# Patient Record
Sex: Male | Born: 1995 | Race: White | Hispanic: No | Marital: Single
Health system: Southern US, Community
[De-identification: ages and names within clinical notes are randomized; demographics above are authoritative.]

---

## 2018-12-07 ENCOUNTER — Emergency Department (HOSPITAL_COMMUNITY): Payer: BC Managed Care – PPO

## 2018-12-07 ENCOUNTER — Emergency Department (HOSPITAL_COMMUNITY)
Admission: EM | Admit: 2018-12-07 | Discharge: 2018-12-07 | Disposition: A | Discharge status: Home / Self Care | Payer: BC Managed Care – PPO | Attending: Emergency Medicine | Admitting: Emergency Medicine

## 2018-12-07 ENCOUNTER — Other Ambulatory Visit: Payer: Self-pay

## 2018-12-07 ENCOUNTER — Encounter (HOSPITAL_COMMUNITY): Payer: Self-pay

## 2018-12-07 DIAGNOSIS — Y999 Unspecified external cause status: Secondary | ICD-10-CM | POA: Insufficient documentation

## 2018-12-07 DIAGNOSIS — W231XXA Caught, crushed, jammed, or pinched between stationary objects, initial encounter: Secondary | ICD-10-CM | POA: Diagnosis not present

## 2018-12-07 DIAGNOSIS — R4020 Unspecified coma: Secondary | ICD-10-CM | POA: Diagnosis not present

## 2018-12-07 DIAGNOSIS — S3991XA Unspecified injury of abdomen, initial encounter: Secondary | ICD-10-CM | POA: Diagnosis not present

## 2018-12-07 DIAGNOSIS — T07XXXA Unspecified multiple injuries, initial encounter: Secondary | ICD-10-CM | POA: Diagnosis not present

## 2018-12-07 DIAGNOSIS — M545 Low back pain: Secondary | ICD-10-CM | POA: Diagnosis not present

## 2018-12-07 DIAGNOSIS — S12690A Other displaced fracture of seventh cervical vertebra, initial encounter for closed fracture: Secondary | ICD-10-CM | POA: Diagnosis not present

## 2018-12-07 DIAGNOSIS — R102 Pelvic and perineal pain: Secondary | ICD-10-CM | POA: Diagnosis not present

## 2018-12-07 DIAGNOSIS — Y93H9 Activity, other involving exterior property and land maintenance, building and construction: Secondary | ICD-10-CM | POA: Insufficient documentation

## 2018-12-07 DIAGNOSIS — S3992XA Unspecified injury of lower back, initial encounter: Secondary | ICD-10-CM | POA: Diagnosis not present

## 2018-12-07 DIAGNOSIS — R55 Syncope and collapse: Secondary | ICD-10-CM | POA: Insufficient documentation

## 2018-12-07 DIAGNOSIS — R079 Chest pain, unspecified: Secondary | ICD-10-CM | POA: Diagnosis not present

## 2018-12-07 DIAGNOSIS — S299XXA Unspecified injury of thorax, initial encounter: Secondary | ICD-10-CM | POA: Diagnosis not present

## 2018-12-07 DIAGNOSIS — S3993XA Unspecified injury of pelvis, initial encounter: Secondary | ICD-10-CM | POA: Diagnosis not present

## 2018-12-07 DIAGNOSIS — S12600A Unspecified displaced fracture of seventh cervical vertebra, initial encounter for closed fracture: Secondary | ICD-10-CM | POA: Diagnosis not present

## 2018-12-07 DIAGNOSIS — Y9279 Other farm location as the place of occurrence of the external cause: Secondary | ICD-10-CM | POA: Diagnosis not present

## 2018-12-07 DIAGNOSIS — S199XXA Unspecified injury of neck, initial encounter: Secondary | ICD-10-CM | POA: Diagnosis not present

## 2018-12-07 DIAGNOSIS — S0990XA Unspecified injury of head, initial encounter: Secondary | ICD-10-CM | POA: Diagnosis not present

## 2018-12-07 DIAGNOSIS — R1030 Lower abdominal pain, unspecified: Secondary | ICD-10-CM | POA: Insufficient documentation

## 2018-12-07 DIAGNOSIS — R52 Pain, unspecified: Secondary | ICD-10-CM | POA: Diagnosis not present

## 2018-12-07 LAB — PROTIME-INR
INR: 1 (ref 0.8–1.2)
Prothrombin Time: 13.4 seconds (ref 11.4–15.2)

## 2018-12-07 LAB — CBC
HCT: 45.1 % (ref 39.0–52.0)
Hemoglobin: 15.5 g/dL (ref 13.0–17.0)
MCH: 30 pg (ref 26.0–34.0)
MCHC: 34.4 g/dL (ref 30.0–36.0)
MCV: 87.2 fL (ref 80.0–100.0)
Platelets: 240 10*3/uL (ref 150–400)
RBC: 5.17 MIL/uL (ref 4.22–5.81)
RDW: 12.8 % (ref 11.5–15.5)
WBC: 11.9 10*3/uL — ABNORMAL HIGH (ref 4.0–10.5)
nRBC: 0 % (ref 0.0–0.2)

## 2018-12-07 LAB — I-STAT CHEM 8, ED
BUN: 16 mg/dL (ref 6–20)
Calcium, Ion: 1.03 mmol/L — ABNORMAL LOW (ref 1.15–1.40)
Chloride: 105 mmol/L (ref 98–111)
Creatinine, Ser: 0.9 mg/dL (ref 0.61–1.24)
Glucose, Bld: 107 mg/dL — ABNORMAL HIGH (ref 70–99)
HCT: 44 % (ref 39.0–52.0)
Hemoglobin: 15 g/dL (ref 13.0–17.0)
Potassium: 4.2 mmol/L (ref 3.5–5.1)
Sodium: 139 mmol/L (ref 135–145)
TCO2: 24 mmol/L (ref 22–32)

## 2018-12-07 LAB — SAMPLE TO BLOOD BANK

## 2018-12-07 LAB — COMPREHENSIVE METABOLIC PANEL
ALT: 71 U/L — ABNORMAL HIGH (ref 0–44)
AST: 44 U/L — ABNORMAL HIGH (ref 15–41)
Albumin: 4.4 g/dL (ref 3.5–5.0)
Alkaline Phosphatase: 54 U/L (ref 38–126)
Anion gap: 12 (ref 5–15)
BUN: 12 mg/dL (ref 6–20)
CO2: 20 mmol/L — ABNORMAL LOW (ref 22–32)
Calcium: 9.2 mg/dL (ref 8.9–10.3)
Chloride: 107 mmol/L (ref 98–111)
Creatinine, Ser: 0.99 mg/dL (ref 0.61–1.24)
GFR calc Af Amer: >60 mL/min (ref >60)
GFR calc non Af Amer: >60 mL/min (ref >60)
Glucose, Bld: 108 mg/dL — ABNORMAL HIGH (ref 70–99)
Potassium: 4.4 mmol/L (ref 3.5–5.1)
Sodium: 139 mmol/L (ref 135–145)
Total Bilirubin: 1.1 mg/dL (ref 0.3–1.2)
Total Protein: 7.1 g/dL (ref 6.5–8.1)

## 2018-12-07 LAB — URINALYSIS, ROUTINE W REFLEX MICROSCOPIC
Bilirubin Urine: NEGATIVE
Glucose, UA: NEGATIVE mg/dL
Hgb urine dipstick: NEGATIVE
Ketones, ur: NEGATIVE mg/dL
Leukocytes,Ua: NEGATIVE
Nitrite: NEGATIVE
Protein, ur: NEGATIVE mg/dL
Specific Gravity, Urine: 1.039 — ABNORMAL HIGH (ref 1.005–1.030)
pH: 5 (ref 5.0–8.0)

## 2018-12-07 LAB — CDS SEROLOGY

## 2018-12-07 LAB — ETHANOL: Alcohol, Ethyl (B): <10 mg/dL (ref <10)

## 2018-12-07 LAB — LACTIC ACID, PLASMA: Lactic Acid, Venous: 1.5 mmol/L (ref 0.5–1.9)

## 2018-12-07 MED ORDER — FENTANYL CITRATE (PF) 100 MCG/2ML IJ SOLN
INTRAMUSCULAR | Status: AC
  Filled 2018-12-07: qty 2

## 2018-12-07 MED ORDER — MORPHINE SULFATE (PF) 4 MG/ML IV SOLN
4.0000 mg | Freq: Once | INTRAVENOUS | Status: AC
  Administered 2018-12-07: 4 mg via INTRAVENOUS
  Filled 2018-12-07: qty 1

## 2018-12-07 MED ORDER — OXYCODONE-ACETAMINOPHEN 5-325 MG PO TABS: 1.0000 | ORAL_TABLET | Freq: Four times a day (QID) | ORAL | 0 refills | Status: AC | PRN

## 2018-12-07 MED ORDER — FENTANYL CITRATE (PF) 100 MCG/2ML IJ SOLN
100.0000 ug | Freq: Once | INTRAMUSCULAR | Status: DC
  Filled 2018-12-07: qty 2

## 2018-12-07 MED ORDER — FENTANYL CITRATE (PF) 100 MCG/2ML IJ SOLN
INTRAMUSCULAR | Status: AC | PRN
  Administered 2018-12-07: 100 ug via INTRAVENOUS

## 2018-12-07 MED ORDER — IOHEXOL 300 MG/ML  SOLN
100.0000 mL | Freq: Once | INTRAMUSCULAR | Status: AC | PRN
  Administered 2018-12-07: 100 mL via INTRAVENOUS
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
## 2018-12-07 NOTE — Progress Notes (Signed)
Orthopedic Tech Progress Note Patient Details:  Jordan Mcdonald Jun 08, 1995 [id number]  Level 1 trauma ortho visit  Patient ID: Jordan Mcdonald, male   DOB: 23-Apr-1995, 23 y.o.   MRN: [id number]   Jordan Mcdonald 12/07/2018, 2:53 PM

## 2018-12-07 NOTE — ED Notes (Signed)
Pt had removed C[place]lar due to it being uncomfortable 5 minutes ago. Pt placed in [place] collar at this time. Pt had also removed his BP cuff. BP cuff replaced. Pt educated on need to leave [place] on and in place. Pt verbalized understanding.
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
## 2018-12-07 NOTE — ED Provider Notes (Signed)
Emergency Department Provider Note   I have reviewed the triage vital signs and the nursing notes.   HISTORY  Chief Complaint Trauma   HPI Jordan Mcdonald is a 23 y.o. male presents to the emergency department by EMS as a level 2 trauma after an approximately 850 pound bale of hay fell on him from approximately 5 feet.  He was pinned against a bar and felt a pop/pain in his neck.  Patient was witnessed to then have a syncope event but awoke immediately.  They deny any seizure activity.  Patient with pain in his lower abdomen, back, neck at this time.  No numbness, tingling, weakness.  No vision changes.  No chest pain or shortness of breath. EMS noted bradycardia in route as low as 45.    History reviewed. No pertinent past medical history.  There are no active problems to display for this patient.  Allergies Patient has no known allergies.  No family history on file.  Social History Social History   Tobacco Use   Smoking status: Not on file  Substance Use Topics   Alcohol use: Not on file   Drug use: Not on file    Review of Systems  Constitutional: No fever/chills Eyes: No visual changes. ENT: No sore throat. Cardiovascular: Denies chest pain. Positive syncope.  Respiratory: Denies shortness of breath. Gastrointestinal: Positive lower abdominal pain.  No nausea, no vomiting.  No diarrhea.  No constipation. Genitourinary: Negative for dysuria. Musculoskeletal: Positive lower back and neck pain.  Skin: Negative for rash. Neurological: Negative for headaches, focal weakness or numbness.  10-point ROS otherwise negative.  ____________________________________________   PHYSICAL EXAM:  VITAL SIGNS: ED Triage Vitals  Enc Vitals Group     BP 12/07/18 1430 (!) 151/87     Pulse Rate 12/07/18 1430 62     Resp 12/07/18 1430 15     Temp 12/07/18 1430 (!) 97.1 F (36.2 C)     Temp src --      SpO2 12/07/18 1423 96 %     Weight 12/07/18 1427 220 lb (99.8 kg)      Height 12/07/18 1427 5\' 7"  (1.702 m)   Constitutional: Alert and oriented. Well appearing and in no acute distress. Eyes: Conjunctivae are normal. PERRL.  Head: Atraumatic. Ears:  Healthy appearing ear canals and TMs bilaterally.  No hemotympanum.  Nose: No congestion/rhinnorhea. Mouth/Throat: Mucous membranes are moist.  Oropharynx non-erythematous. Neck: No stridor. C collar in place. In-line spine stabilization maintained with palpation along the c-spine. Patient with Tenderness along the midline in the C4-6 region.  Cardiovascular: Normal rate, regular rhythm. Good peripheral circulation. Grossly normal heart sounds.   Respiratory: Normal respiratory effort.  No retractions. Lungs CTAB. Gastrointestinal: Soft with tenderness diffusely in the lower abdomen. No rebound, guarding, or bruising. No distention.  Musculoskeletal: No lower extremity tenderness nor edema. No gross deformities of extremities. Stable pelvis.  Neurologic:  Normal speech and language. No gross focal neurologic deficits are appreciated. Normal strength and sensation in the upper and lower extremities. GCS 15.  Skin:  Skin is warm, dry and intact. No rash noted.   ____________________________________________   LABS (all labs ordered are listed, but only abnormal results are displayed)  Labs Reviewed  COMPREHENSIVE METABOLIC PANEL - Abnormal; Notable for the following components:      Result Value   CO2 20 (*)    Glucose, Bld 108 (*)    AST 44 (*)    ALT 71 (*)  All other components within normal limits  CBC - Abnormal; Notable for the following components:   WBC 11.9 (*)    All other components within normal limits  URINALYSIS, ROUTINE W REFLEX MICROSCOPIC - Abnormal; Notable for the following components:   Specific Gravity, Urine 1.039 (*)    All other components within normal limits  I-STAT CHEM 8, ED - Abnormal; Notable for the following components:   Glucose, Bld 107 (*)    Calcium, Ion 1.03 (*)     All other components within normal limits  CDS SEROLOGY  ETHANOL  LACTIC ACID, PLASMA  PROTIME-INR  SAMPLE TO BLOOD BANK   ____________________________________________  EKG   EKG Interpretation  Date/Time:  Wednesday December 07 2018 15:03:29 EST Ventricular Rate:  82 PR Interval:    QRS Duration: 102 QT Interval:  348 QTC Calculation: 407 R Axis:   48 Text Interpretation: Sinus rhythm No old tracing to compare Confirmed by Meridee ScoreButler, Michael [phone]([id number]) on 12/07/2018 4:27:40 PM       ____________________________________________  RADIOLOGY  Ct Head Wo Contrast  Result Date: 12/07/2018 CLINICAL DATA:  Struck by bale of hay EXAM: CT HEAD WITHOUT CONTRAST CT CERVICAL SPINE WITHOUT CONTRAST TECHNIQUE: Multidetector CT imaging of the head and cervical spine was performed following the standard protocol without intravenous contrast. Multiplanar CT image reconstructions of the cervical spine were also generated. COMPARISON:  None. FINDINGS: CT HEAD FINDINGS Brain: No evidence of acute infarction, hemorrhage, hydrocephalus, extra-axial collection or mass lesion/mass effect. Vascular: No hyperdense vessel or unexpected calcification. Skull: No calvarial fracture or suspicious osseous lesion. No scalp swelling or hematoma. Sinuses/Orbits: Paranasal sinuses and mastoid air cells are predominantly clear. Included orbital structures are unremarkable. Other: None CT CERVICAL SPINE FINDINGS Alignment: Preservation of the normal cervical lordosis without traumatic listhesis. No abnormal facet widening. Normal alignment of the craniocervical and atlantoaxial articulations. Skull base and vertebrae: Complex minimally displaced fracture involving the lateral mass of C7 with separation from the pedicle and lamina (a floating lateral mass). There is a minimally displaced fracture extending through the right transverse process of T1 as well. No other cervical spine fracture or traumatic malalignment. Soft  tissues and spinal canal: Small amount of stranding and thickening adjacent the C7 lateral mass and T1 transverse process fracture. No visible canal hematoma. Disc levels: No significant central canal or foraminal stenosis identified within the imaged levels of the spine. No fracture violation of the neural foramina or spinal canal. Upper chest: No acute abnormality in the upper chest or imaged lung apices. Dedicated CT of the chest, abdomen and pelvis was performed concurrently. Other: Normal thyroid. IMPRESSION: 1. Complex minimally displaced fracture involving the lateral mass of C7 with separation from the pedicle and lamina (a floating lateral mass - AOSPINE FIII) (10/27, 25; 8/65). 2. Minimally displaced fracture of the right transverse process of T1 (10/27) 3. No other cervical spine fracture or traumatic listhesis. 4. No acute intracranial abnormality. These results were called by telephone at the time of interpretation on 12/07/2018 at 3:34 pm to provider Orell Hurtado , who verbally acknowledged these results. Electronically Signed   By: Kreg ShropshirePrice  DeHay M.D.   On: 12/07/2018 15:42   Ct Chest W Contrast  Result Date: 12/07/2018 CLINICAL DATA:  Struck by hay bale EXAM: CT CHEST, ABDOMEN, AND PELVIS WITH CONTRAST TECHNIQUE: Multidetector CT imaging of the chest, abdomen and pelvis was performed following the standard protocol during bolus administration of intravenous contrast. CONTRAST:  100mL OMNIPAQUE IOHEXOL 300 MG/ML  SOLN COMPARISON:  None. FINDINGS: CT CHEST FINDINGS Cardiovascular: The aortic root is suboptimally assessed given cardiac pulsation artifact. The aorta is normal caliber. No other acute luminal abnormality of the aorta is seen. No periaortic stranding or hemorrhage. Normal heart size. No pericardial effusion. Central pulmonary arteries are normal caliber. No large central filling defects on this non tailored examination. Mediastinum/Nodes: No mediastinal hematoma or pneumomediastinum. No  acute traumatic abnormality of the trachea or esophagus. Thyroid gland and thoracic inlet are unremarkable. Lungs/Pleura: Small amount of dependent atelectasis posteriorly. No acute traumatic injury of the lung parenchyma. Pulmonary cyst noted in the left lung base. No consolidation, features of edema, pneumothorax, or effusion. No suspicious pulmonary nodules or masses. Musculoskeletal: Complex C7 right lateral mass fracture better detailed on cervical spine CT. Transverse process fracture of T1-1 on the right is also better seen on the comparison CT cervical spine imaging. No other acute osseous or soft tissue traumatic process in the chest. CT ABDOMEN PELVIS FINDINGS Hepatobiliary: Diffuse hepatic hypoattenuation compatible with hepatic steatosis. No focal liver abnormality is seen. No gallstones, gallbladder wall thickening, or biliary dilatation. Pancreas: Unremarkable. No pancreatic ductal dilatation or surrounding inflammatory changes. Spleen: No splenic injury or perisplenic hematoma. Small accessory splenule Adrenals/Urinary Tract: No adrenal hematoma or suspicious adrenal lesions. No direct renal injury or perirenal hemorrhage. No extravasation of contrast is seen on excretory phase delayed imaging. Kidneys are otherwise unremarkable, without renal calculi, suspicious lesion, or hydronephrosis. Bladder is unremarkable. Stomach/Bowel: Distal esophagus, stomach and duodenal sweep are unremarkable. No small bowel wall thickening or dilatation. No evidence of obstruction. A normal appendix is visualized. No colonic dilatation or wall thickening. Scattered colonic diverticula without focal pericolonic inflammation to suggest diverticulitis. No mesenteric hematoma or contusion Vascular/Lymphatic: No direct vascular injury in the abdomen or pelvis. No suspicious or enlarged lymph nodes in the included lymphatic chains. Reproductive: The prostate and seminal vesicles are unremarkable. Other: No free fluid or free  air. No traumatic abdominal wall hernia. Musculoskeletal: No acute osseous or soft tissue injury in the abdomen or pelvis. Benign bone islands are present in the right ilium/acetabulum and right femoral head. No suspicious osseous lesions IMPRESSION: 1. No evidence of acute traumatic injury within the chest, abdomen, or pelvis. 2. Fractures of C7 and T1 are better detailed on concurrent CT cervical spine imaging. Please refer to the dedicated report. 3. Hepatic steatosis. These results were called by telephone at the time of interpretation on 12/07/2018 at 3:34 pm to provider Mariel Gaudin , who verbally acknowledged these results. Electronically Signed   By: Kreg Shropshire M.D.   On: 12/07/2018 15:41   Ct Cervical Spine Wo Contrast  Result Date: 12/07/2018 CLINICAL DATA:  Struck by bale of hay EXAM: CT HEAD WITHOUT CONTRAST CT CERVICAL SPINE WITHOUT CONTRAST TECHNIQUE: Multidetector CT imaging of the head and cervical spine was performed following the standard protocol without intravenous contrast. Multiplanar CT image reconstructions of the cervical spine were also generated. COMPARISON:  None. FINDINGS: CT HEAD FINDINGS Brain: No evidence of acute infarction, hemorrhage, hydrocephalus, extra-axial collection or mass lesion/mass effect. Vascular: No hyperdense vessel or unexpected calcification. Skull: No calvarial fracture or suspicious osseous lesion. No scalp swelling or hematoma. Sinuses/Orbits: Paranasal sinuses and mastoid air cells are predominantly clear. Included orbital structures are unremarkable. Other: None CT CERVICAL SPINE FINDINGS Alignment: Preservation of the normal cervical lordosis without traumatic listhesis. No abnormal facet widening. Normal alignment of the craniocervical and atlantoaxial articulations. Skull base and vertebrae: Complex minimally displaced fracture involving the lateral mass  of C7 with separation from the pedicle and lamina (a floating lateral mass). There is a minimally  displaced fracture extending through the right transverse process of T1 as well. No other cervical spine fracture or traumatic malalignment. Soft tissues and spinal canal: Small amount of stranding and thickening adjacent the C7 lateral mass and T1 transverse process fracture. No visible canal hematoma. Disc levels: No significant central canal or foraminal stenosis identified within the imaged levels of the spine. No fracture violation of the neural foramina or spinal canal. Upper chest: No acute abnormality in the upper chest or imaged lung apices. Dedicated CT of the chest, abdomen and pelvis was performed concurrently. Other: Normal thyroid. IMPRESSION: 1. Complex minimally displaced fracture involving the lateral mass of C7 with separation from the pedicle and lamina (a floating lateral mass - AOSPINE FIII) (10/27, 25; 8/65). 2. Minimally displaced fracture of the right transverse process of T1 (10/27) 3. No other cervical spine fracture or traumatic listhesis. 4. No acute intracranial abnormality. These results were called by telephone at the time of interpretation on 12/07/2018 at 3:34 pm to provider Ahmaud Duthie , who verbally acknowledged these results. Electronically Signed   By: Kreg Shropshire M.D.   On: 12/07/2018 15:42   Mr Cervical Spine Wo Contrast  Result Date: 12/07/2018 CLINICAL DATA:  Fractures of C7 and T1. EXAM: MRI CERVICAL SPINE WITHOUT CONTRAST TECHNIQUE: Multiplanar, multisequence MR imaging of the cervical spine was performed. No intravenous contrast was administered. COMPARISON:  CT scan dated 12/07/2018 FINDINGS: Alignment: Physiologic. Vertebrae: There is a subtle fracture of the right lateral mass of T7 with minimal impaction but without significant displacement. No visible foraminal encroachment. Slight haziness in the right neural foramen at C6-7 which could represent minimal hemorrhage. No adjacent hematoma. Subtle small effusions of the low right facet joints at C6-7 and C7-T1  seen on image 1 of series 4. Cord: Normal signal and morphology. Posterior Fossa, vertebral arteries, paraspinal tissues: Negative. Disc levels: C2-3 through C5-6: Normal. C6-7: Tiny central disc bulge and annular fissure without neural impingement. Subtle haziness in the right neural foramen. Subtle fracture through the right lateral mass with a minimal effusion in the right facet joint. No evidence of a hematoma in the spinal canal. C7-T1: Disc is normal. Minimal effusion in the right facet joint. Otherwise normal. T1-2 and T2-3: Normal. IMPRESSION: 1. Subtle fracture of the right lateral mass of T7 with minimal impaction but no significant displacement. 2. Subtle haziness in the right neural foramen at C6-7 which could represent minimal hemorrhage. Electronically Signed   By: Francene Boyers M.D.   On: 12/07/2018 18:18   Ct Abdomen Pelvis W Contrast  Result Date: 12/07/2018 CLINICAL DATA:  Struck by hay bale EXAM: CT CHEST, ABDOMEN, AND PELVIS WITH CONTRAST TECHNIQUE: Multidetector CT imaging of the chest, abdomen and pelvis was performed following the standard protocol during bolus administration of intravenous contrast. CONTRAST:  OMNIPAQUE IOHEXOL 300 MG/ML  SOLN COMPARISON:  None. FINDINGS: CT CHEST FINDINGS Cardiovascular: The aortic root is suboptimally assessed given cardiac pulsation artifact. The aorta is normal caliber. No other acute luminal abnormality of the aorta is seen. No periaortic stranding or hemorrhage. Normal heart size. No pericardial effusion. Central pulmonary arteries are normal caliber. No large central filling defects on this non tailored examination. Mediastinum/Nodes: No mediastinal hematoma or pneumomediastinum. No acute traumatic abnormality of the trachea or esophagus. Thyroid gland and thoracic inlet are unremarkable. Lungs/Pleura: Small amount of dependent atelectasis posteriorly. No acute traumatic injury of  the lung parenchyma. Pulmonary cyst noted in the left lung  base. No consolidation, features of edema, pneumothorax, or effusion. No suspicious pulmonary nodules or masses. Musculoskeletal: Complex C7 right lateral mass fracture better detailed on cervical spine CT. Transverse process fracture of T1-1 on the right is also better seen on the comparison CT cervical spine imaging. No other acute osseous or soft tissue traumatic process in the chest. CT ABDOMEN PELVIS FINDINGS Hepatobiliary: Diffuse hepatic hypoattenuation compatible with hepatic steatosis. No focal liver abnormality is seen. No gallstones, gallbladder wall thickening, or biliary dilatation. Pancreas: Unremarkable. No pancreatic ductal dilatation or surrounding inflammatory changes. Spleen: No splenic injury or perisplenic hematoma. Small accessory splenule Adrenals/Urinary Tract: No adrenal hematoma or suspicious adrenal lesions. No direct renal injury or perirenal hemorrhage. No extravasation of contrast is seen on excretory phase delayed imaging. Kidneys are otherwise unremarkable, without renal calculi, suspicious lesion, or hydronephrosis. Bladder is unremarkable. Stomach/Bowel: Distal esophagus, stomach and duodenal sweep are unremarkable. No small bowel wall thickening or dilatation. No evidence of obstruction. A normal appendix is visualized. No colonic dilatation or wall thickening. Scattered colonic diverticula without focal pericolonic inflammation to suggest diverticulitis. No mesenteric hematoma or contusion Vascular/Lymphatic: No direct vascular injury in the abdomen or pelvis. No suspicious or enlarged lymph nodes in the included lymphatic chains. Reproductive: The prostate and seminal vesicles are unremarkable. Other: No free fluid or free air. No traumatic abdominal wall hernia. Musculoskeletal: No acute osseous or soft tissue injury in the abdomen or pelvis. Benign bone islands are present in the right ilium/acetabulum and right femoral head. No suspicious osseous lesions IMPRESSION: 1. No  evidence of acute traumatic injury within the chest, abdomen, or pelvis. 2. Fractures of C7 and T1 are better detailed on concurrent CT cervical spine imaging. Please refer to the dedicated report. 3. Hepatic steatosis. These results were called by telephone at the time of interpretation on 12/07/2018 at 3:34 pm to provider Verle Brillhart , who verbally acknowledged these results. Electronically Signed   By: Kreg Shropshire M.D.   On: 12/07/2018 15:41   Dg Pelvis Portable  Result Date: 12/07/2018 CLINICAL DATA:  Trauma with pain EXAM: PORTABLE PELVIS 1-2 VIEWS COMPARISON:  None. FINDINGS: There is no evidence of pelvic fracture or dislocation. Joint spaces appear unremarkable. No erosive change. IMPRESSION: No evident fracture or dislocation.  No appreciable arthropathy. Electronically Signed   By: Bretta Bang III M.D.   On: 12/07/2018 14:44   Dg Chest Port 1 View  Result Date: 12/07/2018 CLINICAL DATA:  Pain following trauma with heavy object falling upon patient EXAM: PORTABLE CHEST 1 VIEW COMPARISON:  February 16, 2011 FINDINGS: Lungs are clear. Heart is borderline enlarged with pulmonary vascularity normal. No pneumothorax. No adenopathy. No bone lesions are demonstrable. IMPRESSION: Borderline cardiac prominence.  Lungs clear.  No pneumothorax. Electronically Signed   By: Bretta Bang III M.D.   On: 12/07/2018 14:45    ____________________________________________   PROCEDURES  Procedure(s) performed:   Procedures  FAST Exam: Limited Ultrasound of the abdomen and pericardium (FAST Exam).  Multiple views of the abdomen and pericardium are obtained with a multi-frequency probe.  EMERGENCY DEPARTMENT [place] FAST EXAM  INDICATIONS:Abnornal vitals, Blunt trauma to the thorax and Blunt injury of abdomen  PERFORMED BY: Myself  FINDINGS: All views negative  LIMITATIONS:  Body habitus and Emergent procedure  INTERPRETATION:  No abdominal free fluid and No pericardial effusion  CPT  Codes: cardiac [id number], abdomen [phone] (study includes both codes)   CRITICAL CARE Performed  by: Maia Plan Total critical care time: 35 minutes Critical care time was exclusive of separately billable procedures and treating other patients. Critical care was necessary to treat or prevent imminent or life-threatening deterioration. Critical care was time spent personally by me on the following activities: development of treatment plan with patient and/or surrogate as well as nursing, discussions with consultants, evaluation of patient's response to treatment, examination of patient, obtaining history from patient or surrogate, ordering and performing treatments and interventions, ordering and review of laboratory studies, ordering and review of radiographic studies, pulse oximetry and re-evaluation of patient's condition.  Alona Bene, MD Emergency Medicine  ____________________________________________   INITIAL IMPRESSION / ASSESSMENT AND PLAN / ED COURSE  Pertinent labs & imaging results that were available during my care of the patient were reviewed by me and considered in my medical decision making (see chart for details).   Patient arrives as a level 2 trauma.  GCS 15.  EMS reports some bradycardia in route but no hypotension.  Patient apparently had syncope on scene which I suspect is vasovagal.  He states that in the past when he has injured himself he is passed out as well.  FAST exam negative.  Reviewed the plain films of the chest and pelvis and the trauma bay with no obvious pneumothorax or pelvic fracture.  Patient going for pan scan given the mechanism.   03:35 PM  Spoke with radiology regarding CT c spine findings. Paged NSG.   Discussed with NSG. [employer] and will send for MRI to assess for ligamentous injury. Care transferred to Dr. Charm Barges.  ____________________________________________  FINAL CLINICAL IMPRESSION(S) / ED DIAGNOSES  Final diagnoses:  Closed  fracture of seventh cervical vertebra without spinal cord injury, initial encounter [facility])     MEDICATIONS GIVEN DURING THIS VISIT:  Medications  fentaNYL (SUBLIMAZE) injection (100 mcg Intravenous Given 12/07/18 1428)  iohexol (OMNIPAQUE) 300 MG/ML solution 100 mL (100 mLs Intravenous Contrast Given 12/07/18 1504)  morphine 4 MG/ML injection 4 mg (4 mg Intravenous Given 12/07/18 1622)     NEW OUTPATIENT MEDICATIONS STARTED DURING THIS VISIT:  Discharge Medication List as of 12/07/2018  7:20 PM    START taking these medications   Details  oxyCODONE-acetaminophen (PERCOCET/ROXICET) 5-325 MG tablet Take 1-2 tablets by mouth every 6 (six) hours as needed for severe pain., Starting Wed 12/07/2018, Normal        Note:  This document was prepared using Dragon voice recognition software and may include unintentional dictation errors.  Alona Bene, MD, [facility] Emergency Medicine    Tykera Skates, Arlyss Repress, MD 12/08/18 [phone]

## 2018-12-13 DIAGNOSIS — S12691A Other nondisplaced fracture of seventh cervical vertebra, initial encounter for closed fracture: Secondary | ICD-10-CM | POA: Diagnosis not present

## 2018-12-30 DIAGNOSIS — S12691A Other nondisplaced fracture of seventh cervical vertebra, initial encounter for closed fracture: Secondary | ICD-10-CM | POA: Diagnosis not present

## 2019-01-03 ENCOUNTER — Other Ambulatory Visit: Payer: Self-pay | Admitting: Neurosurgery

## 2019-01-03 DIAGNOSIS — S12691A Other nondisplaced fracture of seventh cervical vertebra, initial encounter for closed fracture: Secondary | ICD-10-CM

## 2019-01-24 ENCOUNTER — Ambulatory Visit
Admission: RE | Admit: 2019-01-24 | Discharge: 2019-01-24 | Disposition: A | Discharge status: Home / Self Care | Payer: BC Managed Care – PPO | Source: Ambulatory Visit | Attending: Neurosurgery | Admitting: Neurosurgery

## 2019-01-24 ENCOUNTER — Other Ambulatory Visit: Payer: Self-pay

## 2019-01-24 DIAGNOSIS — S12601D Unspecified nondisplaced fracture of seventh cervical vertebra, subsequent encounter for fracture with routine healing: Secondary | ICD-10-CM | POA: Diagnosis not present

## 2019-01-24 DIAGNOSIS — S12691A Other nondisplaced fracture of seventh cervical vertebra, initial encounter for closed fracture: Secondary | ICD-10-CM | POA: Diagnosis not present

## 2019-02-21 DIAGNOSIS — S12691A Other nondisplaced fracture of seventh cervical vertebra, initial encounter for closed fracture: Secondary | ICD-10-CM | POA: Diagnosis not present

## 2019-04-04 DIAGNOSIS — G8929 Other chronic pain: Secondary | ICD-10-CM | POA: Diagnosis not present

## 2019-04-04 DIAGNOSIS — S12691A Other nondisplaced fracture of seventh cervical vertebra, initial encounter for closed fracture: Secondary | ICD-10-CM | POA: Diagnosis not present

## 2019-04-04 DIAGNOSIS — M545 Low back pain: Secondary | ICD-10-CM | POA: Diagnosis not present

## 2019-04-11 DIAGNOSIS — M545 Low back pain: Secondary | ICD-10-CM | POA: Diagnosis not present

## 2019-04-11 DIAGNOSIS — M546 Pain in thoracic spine: Secondary | ICD-10-CM | POA: Diagnosis not present

## 2019-05-30 DIAGNOSIS — Z6825 Body mass index (BMI) 25.0-25.9, adult: Secondary | ICD-10-CM | POA: Diagnosis not present

## 2020-05-07 DIAGNOSIS — F419 Anxiety disorder, unspecified: Secondary | ICD-10-CM | POA: Diagnosis not present

## 2020-05-07 DIAGNOSIS — G40909 Epilepsy, unspecified, not intractable, without status epilepticus: Secondary | ICD-10-CM | POA: Diagnosis not present

## 2020-05-10 IMAGING — CT CT CERVICAL SPINE W/O CM
2 series · 10 of 14 positions shown, 12 images · non-contrast
Comparison: 12/07/2018 cervical spine CT.

CLINICAL DATA: 24-year-old male status post right C7 facet and
right T1 transverse process fractures in [REDACTED].

EXAM:
CT CERVICAL SPINE WITHOUT CONTRAST
TECHNIQUE: Multidetector CT imaging of the cervical spine was performed without
intravenous contrast. Multiplanar CT image reconstructions were also
generated.

[Series 3: cspine soft · axial · 0.36mm/px · z∈[-235,-119]mm · 5 of 88 slices shown]
[im 15/88  soft-tissue]
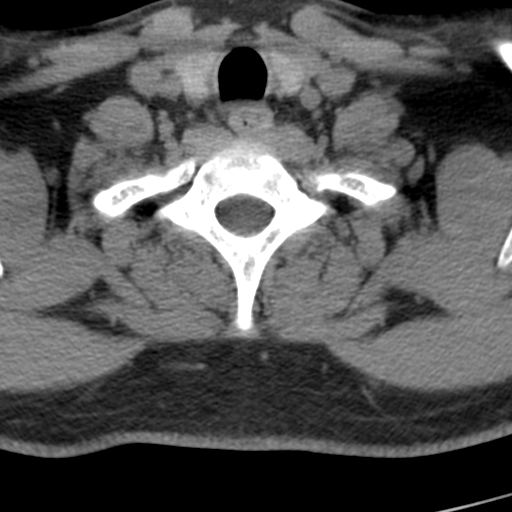
[im 30/88  soft-tissue]
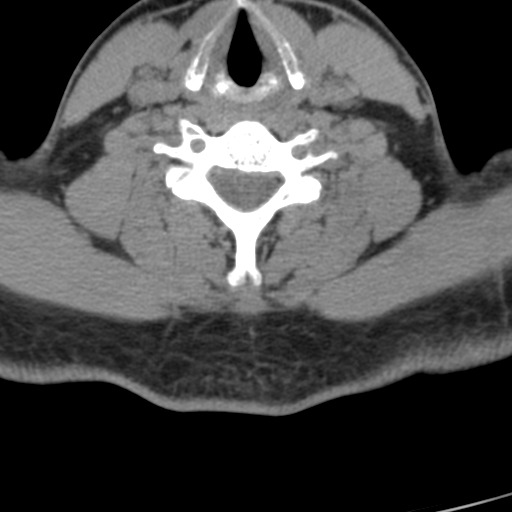
[im 44/88  soft-tissue]
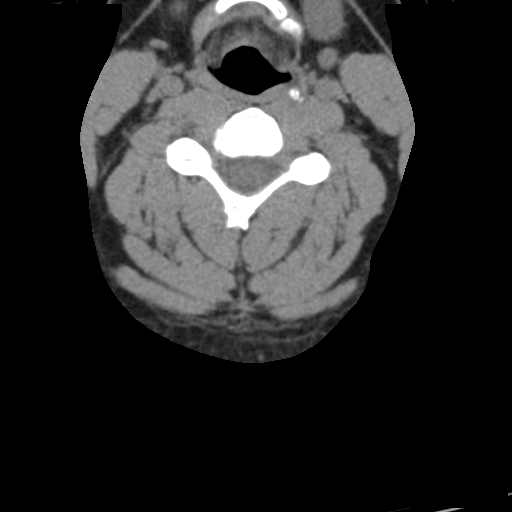
[im 59/88  soft-tissue]
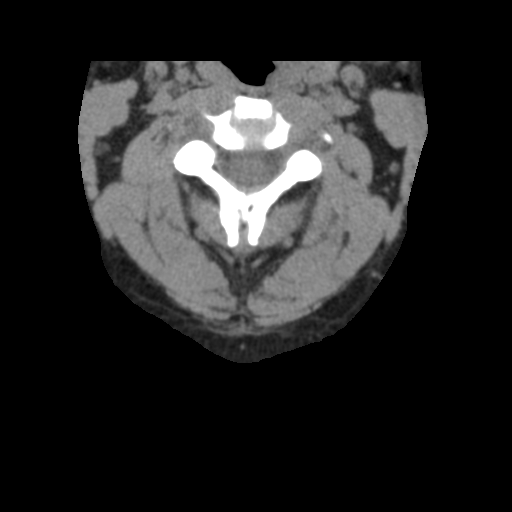
[im 73/88  soft-tissue]
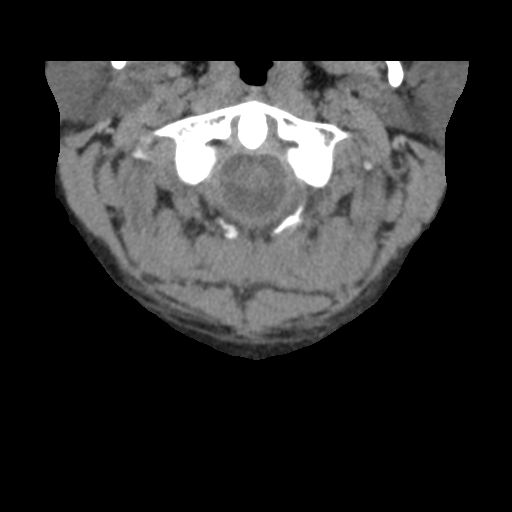

[Series 9: angled axial · axial · 0.29mm/px · z∈[-240,-123]mm · 5 of 89 slices shown, 7 images]
[im 15/89  soft-tissue]
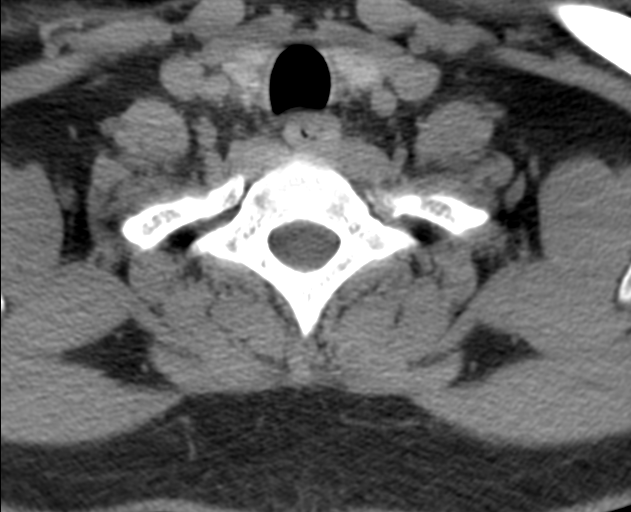
[im 15/89  bone]
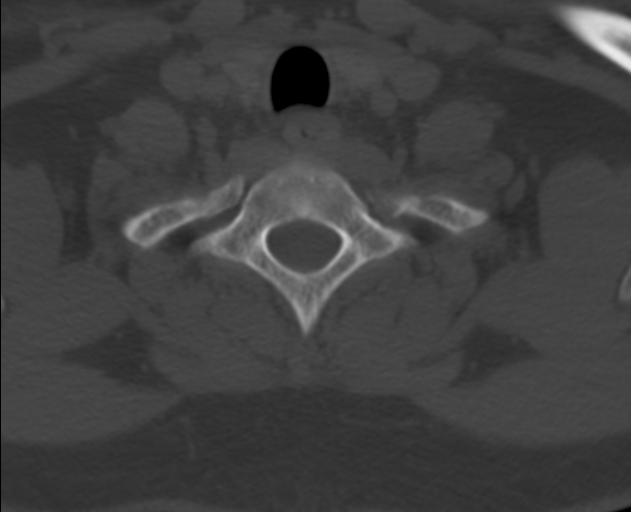
[im 30/89  bone]
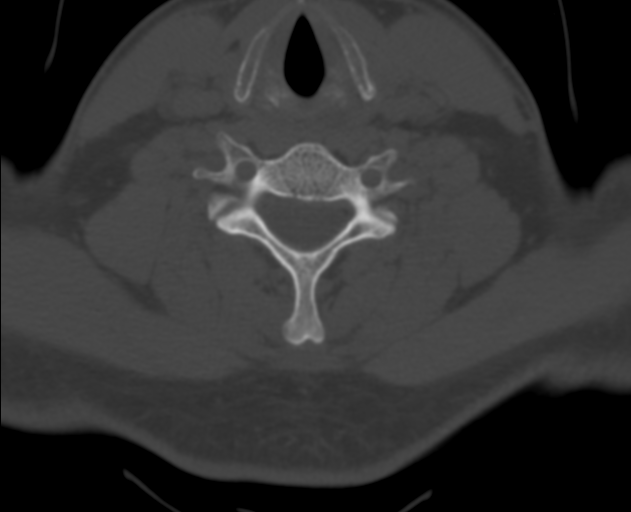
[im 45/89  bone]
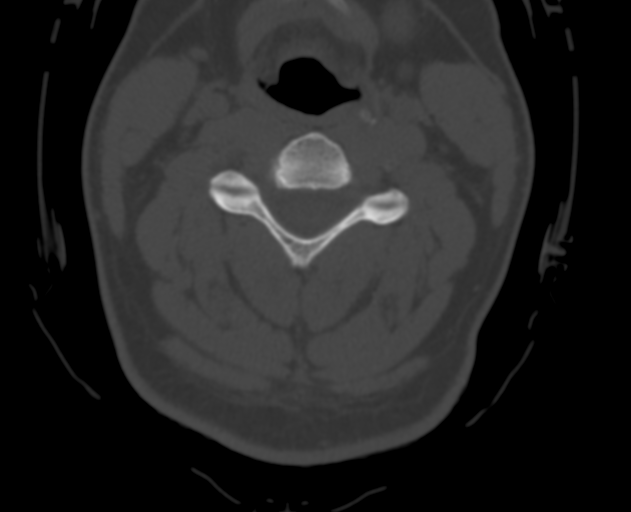
[im 59/89  bone]
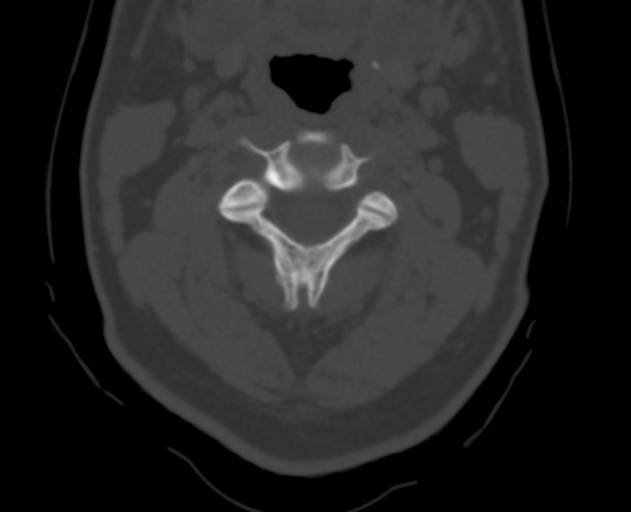
[im 74/89  soft-tissue]
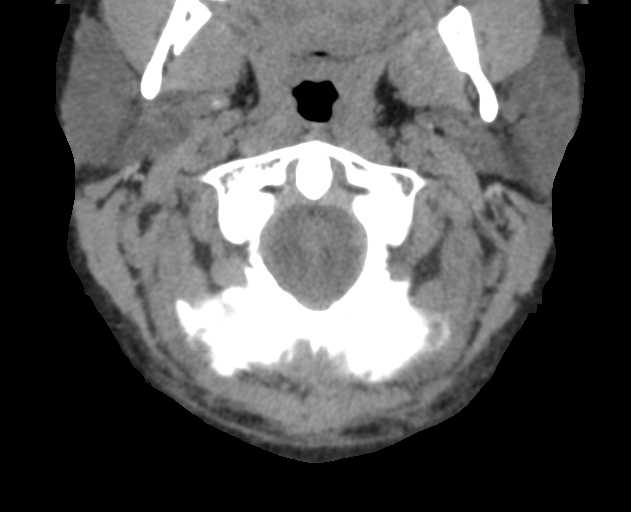
[im 74/89  bone]
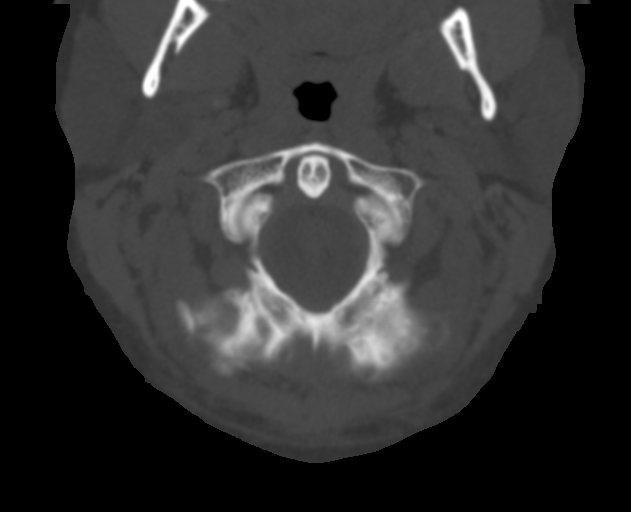

[10 of 14 positions shown; findings below may reference images not displayed]

FINDINGS: Alignment: Increase straightening of cervical lordosis.
Cervicothoracic junction alignment remains normal. Left posterior
element alignment remains normal. Contralateral right posterior
element alignment is normal except at C6-C7 described additionally
below.

Skull base and vertebrae: Visualized skull base is intact. No
atlanto-occipital dissociation. Normal C1-C2 alignment. The C1
through C6 vertebrae remain intact.

Partial healing has occurred at the right C7 comminuted facet
fracture, with substantially reduced fracture lucency on series 4,
image 64 today (compare to series 5, image 77 previously). There
remains some fracture lucency through the right C7 superior
articulating facet best seen on series 4, image 62 and sagittal
image 35. But this remains relatively nondisplaced. Associated
slight widening of the joint space at the superior articulating
facet and minimal perched appearance of the right C6 inferior
articulating facet (same sagittal image). But otherwise satisfactory
right side posterior element alignment there. The C7 vertebrae is
otherwise intact.

Soft tissues and spinal canal: No prevertebral fluid or swelling. No
visible canal hematoma. Negative noncontrast neck soft tissues.

Disc levels: Minor anterior disc calcification has occurred at
C6-C7. No other significant degenerative changes.

Upper chest: Subtle fracture of the right T1 transverse process
appears healed. Other visible upper thoracic levels appear intact.
Negative lung apices.

Other: Negative visible posterior fossa.
IMPRESSION: 1. Partial healing of the comminuted but nondisplaced right C7 facet
fracture since [REDACTED]. Fracture lucency remains most visible at
the superior articulating facet which is also slightly displaced
anteriorly. Posterior element alignment otherwise satisfactory.

2. No new osseous abnormality in the cervical spine. Healed subtle
fracture of the right T1 transverse process.

## 2020-05-15 DIAGNOSIS — F419 Anxiety disorder, unspecified: Secondary | ICD-10-CM | POA: Diagnosis not present

## 2020-05-23 DIAGNOSIS — F419 Anxiety disorder, unspecified: Secondary | ICD-10-CM | POA: Diagnosis not present

## 2020-05-28 DIAGNOSIS — F1210 Cannabis abuse, uncomplicated: Secondary | ICD-10-CM | POA: Diagnosis not present

## 2020-05-28 DIAGNOSIS — G8929 Other chronic pain: Secondary | ICD-10-CM | POA: Diagnosis not present

## 2020-05-28 DIAGNOSIS — M545 Low back pain, unspecified: Secondary | ICD-10-CM | POA: Diagnosis not present

## 2020-05-28 DIAGNOSIS — F410 Panic disorder [episodic paroxysmal anxiety] without agoraphobia: Secondary | ICD-10-CM | POA: Diagnosis not present

## 2020-05-29 DIAGNOSIS — F419 Anxiety disorder, unspecified: Secondary | ICD-10-CM | POA: Diagnosis not present

## 2020-06-04 DIAGNOSIS — F419 Anxiety disorder, unspecified: Secondary | ICD-10-CM | POA: Diagnosis not present

## 2020-06-07 DIAGNOSIS — M5416 Radiculopathy, lumbar region: Secondary | ICD-10-CM | POA: Diagnosis not present

## 2020-06-07 DIAGNOSIS — Z7689 Persons encountering health services in other specified circumstances: Secondary | ICD-10-CM | POA: Diagnosis not present

## 2020-06-07 DIAGNOSIS — E669 Obesity, unspecified: Secondary | ICD-10-CM | POA: Diagnosis not present

## 2020-06-07 DIAGNOSIS — F411 Generalized anxiety disorder: Secondary | ICD-10-CM | POA: Diagnosis not present

## 2020-06-14 DIAGNOSIS — F419 Anxiety disorder, unspecified: Secondary | ICD-10-CM | POA: Diagnosis not present

## 2020-06-18 DIAGNOSIS — M545 Low back pain, unspecified: Secondary | ICD-10-CM | POA: Diagnosis not present

## 2020-06-19 DIAGNOSIS — F419 Anxiety disorder, unspecified: Secondary | ICD-10-CM | POA: Diagnosis not present

## 2020-06-20 DIAGNOSIS — F419 Anxiety disorder, unspecified: Secondary | ICD-10-CM | POA: Diagnosis not present

## 2020-06-25 DIAGNOSIS — M545 Low back pain, unspecified: Secondary | ICD-10-CM | POA: Diagnosis not present

## 2020-06-26 DIAGNOSIS — F419 Anxiety disorder, unspecified: Secondary | ICD-10-CM | POA: Diagnosis not present

## 2020-07-01 DIAGNOSIS — M545 Low back pain, unspecified: Secondary | ICD-10-CM | POA: Diagnosis not present

## 2020-07-02 DIAGNOSIS — F419 Anxiety disorder, unspecified: Secondary | ICD-10-CM | POA: Diagnosis not present

## 2020-07-03 DIAGNOSIS — F419 Anxiety disorder, unspecified: Secondary | ICD-10-CM | POA: Diagnosis not present

## 2020-07-04 DIAGNOSIS — F419 Anxiety disorder, unspecified: Secondary | ICD-10-CM | POA: Diagnosis not present

## 2020-07-08 DIAGNOSIS — F419 Anxiety disorder, unspecified: Secondary | ICD-10-CM | POA: Diagnosis not present

## 2020-07-10 DIAGNOSIS — F419 Anxiety disorder, unspecified: Secondary | ICD-10-CM | POA: Diagnosis not present

## 2020-07-16 DIAGNOSIS — M791 Myalgia, unspecified site: Secondary | ICD-10-CM | POA: Diagnosis not present

## 2020-07-16 DIAGNOSIS — R269 Unspecified abnormalities of gait and mobility: Secondary | ICD-10-CM | POA: Diagnosis not present

## 2020-07-16 DIAGNOSIS — M25551 Pain in right hip: Secondary | ICD-10-CM | POA: Diagnosis not present

## 2020-07-16 DIAGNOSIS — M545 Low back pain, unspecified: Secondary | ICD-10-CM | POA: Diagnosis not present

## 2020-07-24 DIAGNOSIS — M545 Low back pain, unspecified: Secondary | ICD-10-CM | POA: Diagnosis not present

## 2020-07-24 DIAGNOSIS — M791 Myalgia, unspecified site: Secondary | ICD-10-CM | POA: Diagnosis not present

## 2020-07-24 DIAGNOSIS — M25551 Pain in right hip: Secondary | ICD-10-CM | POA: Diagnosis not present

## 2020-07-24 DIAGNOSIS — R269 Unspecified abnormalities of gait and mobility: Secondary | ICD-10-CM | POA: Diagnosis not present

## 2020-07-26 DIAGNOSIS — F419 Anxiety disorder, unspecified: Secondary | ICD-10-CM | POA: Diagnosis not present

## 2020-08-01 DIAGNOSIS — M545 Low back pain, unspecified: Secondary | ICD-10-CM | POA: Diagnosis not present

## 2020-08-01 DIAGNOSIS — M25551 Pain in right hip: Secondary | ICD-10-CM | POA: Diagnosis not present

## 2020-08-01 DIAGNOSIS — R269 Unspecified abnormalities of gait and mobility: Secondary | ICD-10-CM | POA: Diagnosis not present

## 2020-08-01 DIAGNOSIS — M791 Myalgia, unspecified site: Secondary | ICD-10-CM | POA: Diagnosis not present

## 2020-08-02 DIAGNOSIS — M545 Low back pain, unspecified: Secondary | ICD-10-CM | POA: Diagnosis not present

## 2020-08-02 DIAGNOSIS — M549 Dorsalgia, unspecified: Secondary | ICD-10-CM | POA: Diagnosis not present

## 2020-08-02 DIAGNOSIS — R61 Generalized hyperhidrosis: Secondary | ICD-10-CM | POA: Diagnosis not present

## 2020-08-02 DIAGNOSIS — W19XXXA Unspecified fall, initial encounter: Secondary | ICD-10-CM | POA: Diagnosis not present

## 2020-08-02 DIAGNOSIS — X58XXXA Exposure to other specified factors, initial encounter: Secondary | ICD-10-CM | POA: Diagnosis not present

## 2020-08-02 DIAGNOSIS — F419 Anxiety disorder, unspecified: Secondary | ICD-10-CM | POA: Diagnosis not present

## 2020-08-02 DIAGNOSIS — S32010A Wedge compression fracture of first lumbar vertebra, initial encounter for closed fracture: Secondary | ICD-10-CM | POA: Diagnosis not present

## 2020-08-07 DIAGNOSIS — F419 Anxiety disorder, unspecified: Secondary | ICD-10-CM | POA: Diagnosis not present

## 2020-08-07 DIAGNOSIS — R269 Unspecified abnormalities of gait and mobility: Secondary | ICD-10-CM | POA: Diagnosis not present

## 2020-08-07 DIAGNOSIS — M25551 Pain in right hip: Secondary | ICD-10-CM | POA: Diagnosis not present

## 2020-08-07 DIAGNOSIS — M545 Low back pain, unspecified: Secondary | ICD-10-CM | POA: Diagnosis not present

## 2020-08-07 DIAGNOSIS — M791 Myalgia, unspecified site: Secondary | ICD-10-CM | POA: Diagnosis not present

## 2020-08-14 DIAGNOSIS — M545 Low back pain, unspecified: Secondary | ICD-10-CM | POA: Diagnosis not present

## 2020-08-14 DIAGNOSIS — R269 Unspecified abnormalities of gait and mobility: Secondary | ICD-10-CM | POA: Diagnosis not present

## 2020-08-14 DIAGNOSIS — M791 Myalgia, unspecified site: Secondary | ICD-10-CM | POA: Diagnosis not present

## 2020-08-14 DIAGNOSIS — M25551 Pain in right hip: Secondary | ICD-10-CM | POA: Diagnosis not present

## 2020-08-19 DIAGNOSIS — M5416 Radiculopathy, lumbar region: Secondary | ICD-10-CM | POA: Diagnosis not present

## 2020-08-19 DIAGNOSIS — S32000A Wedge compression fracture of unspecified lumbar vertebra, initial encounter for closed fracture: Secondary | ICD-10-CM | POA: Diagnosis not present

## 2020-08-19 DIAGNOSIS — F411 Generalized anxiety disorder: Secondary | ICD-10-CM | POA: Diagnosis not present

## 2020-08-19 DIAGNOSIS — Z6831 Body mass index (BMI) 31.0-31.9, adult: Secondary | ICD-10-CM | POA: Diagnosis not present

## 2020-08-20 DIAGNOSIS — F419 Anxiety disorder, unspecified: Secondary | ICD-10-CM | POA: Diagnosis not present

## 2020-08-21 DIAGNOSIS — R269 Unspecified abnormalities of gait and mobility: Secondary | ICD-10-CM | POA: Diagnosis not present

## 2020-08-21 DIAGNOSIS — M791 Myalgia, unspecified site: Secondary | ICD-10-CM | POA: Diagnosis not present

## 2020-08-21 DIAGNOSIS — M25551 Pain in right hip: Secondary | ICD-10-CM | POA: Diagnosis not present

## 2020-08-21 DIAGNOSIS — M545 Low back pain, unspecified: Secondary | ICD-10-CM | POA: Diagnosis not present

## 2020-08-23 DIAGNOSIS — M5416 Radiculopathy, lumbar region: Secondary | ICD-10-CM | POA: Diagnosis not present

## 2020-08-23 DIAGNOSIS — M545 Low back pain, unspecified: Secondary | ICD-10-CM | POA: Diagnosis not present

## 2020-08-26 DIAGNOSIS — M545 Low back pain, unspecified: Secondary | ICD-10-CM | POA: Diagnosis not present

## 2020-08-26 DIAGNOSIS — M4805 Spinal stenosis, thoracolumbar region: Secondary | ICD-10-CM | POA: Diagnosis not present

## 2020-08-27 DIAGNOSIS — F419 Anxiety disorder, unspecified: Secondary | ICD-10-CM | POA: Diagnosis not present

## 2020-08-27 DIAGNOSIS — F331 Major depressive disorder, recurrent, moderate: Secondary | ICD-10-CM | POA: Diagnosis not present

## 2020-08-27 DIAGNOSIS — G4700 Insomnia, unspecified: Secondary | ICD-10-CM | POA: Diagnosis not present

## 2020-08-27 DIAGNOSIS — Z6831 Body mass index (BMI) 31.0-31.9, adult: Secondary | ICD-10-CM | POA: Diagnosis not present

## 2020-08-27 DIAGNOSIS — F411 Generalized anxiety disorder: Secondary | ICD-10-CM | POA: Diagnosis not present

## 2020-08-28 DIAGNOSIS — M545 Low back pain, unspecified: Secondary | ICD-10-CM | POA: Diagnosis not present

## 2020-08-28 DIAGNOSIS — M791 Myalgia, unspecified site: Secondary | ICD-10-CM | POA: Diagnosis not present

## 2020-08-28 DIAGNOSIS — M25551 Pain in right hip: Secondary | ICD-10-CM | POA: Diagnosis not present

## 2020-08-28 DIAGNOSIS — R269 Unspecified abnormalities of gait and mobility: Secondary | ICD-10-CM | POA: Diagnosis not present

## 2020-08-30 DIAGNOSIS — M5136 Other intervertebral disc degeneration, lumbar region: Secondary | ICD-10-CM | POA: Diagnosis not present

## 2020-09-10 DIAGNOSIS — F5105 Insomnia due to other mental disorder: Secondary | ICD-10-CM | POA: Diagnosis not present

## 2020-09-10 DIAGNOSIS — Z202 Contact with and (suspected) exposure to infections with a predominantly sexual mode of transmission: Secondary | ICD-10-CM | POA: Diagnosis not present

## 2020-09-10 DIAGNOSIS — Z6830 Body mass index (BMI) 30.0-30.9, adult: Secondary | ICD-10-CM | POA: Diagnosis not present

## 2020-09-12 DIAGNOSIS — M25551 Pain in right hip: Secondary | ICD-10-CM | POA: Diagnosis not present

## 2020-09-12 DIAGNOSIS — M791 Myalgia, unspecified site: Secondary | ICD-10-CM | POA: Diagnosis not present

## 2020-09-12 DIAGNOSIS — M545 Low back pain, unspecified: Secondary | ICD-10-CM | POA: Diagnosis not present

## 2020-09-12 DIAGNOSIS — R269 Unspecified abnormalities of gait and mobility: Secondary | ICD-10-CM | POA: Diagnosis not present

## 2020-09-30 DIAGNOSIS — Z79899 Other long term (current) drug therapy: Secondary | ICD-10-CM | POA: Diagnosis not present

## 2020-09-30 DIAGNOSIS — Z008 Encounter for other general examination: Secondary | ICD-10-CM | POA: Diagnosis not present

## 2020-09-30 DIAGNOSIS — G4700 Insomnia, unspecified: Secondary | ICD-10-CM | POA: Diagnosis not present

## 2020-09-30 DIAGNOSIS — F331 Major depressive disorder, recurrent, moderate: Secondary | ICD-10-CM | POA: Diagnosis not present

## 2020-09-30 DIAGNOSIS — F4312 Post-traumatic stress disorder, chronic: Secondary | ICD-10-CM | POA: Diagnosis not present

## 2020-10-07 DIAGNOSIS — M25551 Pain in right hip: Secondary | ICD-10-CM | POA: Diagnosis not present

## 2020-10-07 DIAGNOSIS — M545 Low back pain, unspecified: Secondary | ICD-10-CM | POA: Diagnosis not present

## 2020-10-07 DIAGNOSIS — M791 Myalgia, unspecified site: Secondary | ICD-10-CM | POA: Diagnosis not present

## 2020-10-07 DIAGNOSIS — R269 Unspecified abnormalities of gait and mobility: Secondary | ICD-10-CM | POA: Diagnosis not present

## 2020-10-16 DIAGNOSIS — M545 Low back pain, unspecified: Secondary | ICD-10-CM | POA: Diagnosis not present

## 2020-10-16 DIAGNOSIS — M791 Myalgia, unspecified site: Secondary | ICD-10-CM | POA: Diagnosis not present

## 2020-10-16 DIAGNOSIS — M25551 Pain in right hip: Secondary | ICD-10-CM | POA: Diagnosis not present

## 2020-10-16 DIAGNOSIS — R269 Unspecified abnormalities of gait and mobility: Secondary | ICD-10-CM | POA: Diagnosis not present

## 2020-12-28 DIAGNOSIS — F4312 Post-traumatic stress disorder, chronic: Secondary | ICD-10-CM | POA: Diagnosis not present

## 2020-12-28 DIAGNOSIS — F330 Major depressive disorder, recurrent, mild: Secondary | ICD-10-CM | POA: Diagnosis not present

## 2020-12-28 DIAGNOSIS — G4700 Insomnia, unspecified: Secondary | ICD-10-CM | POA: Diagnosis not present

## 2020-12-28 DIAGNOSIS — F609 Personality disorder, unspecified: Secondary | ICD-10-CM | POA: Diagnosis not present

## 2021-02-22 DIAGNOSIS — F609 Personality disorder, unspecified: Secondary | ICD-10-CM | POA: Diagnosis not present

## 2021-02-22 DIAGNOSIS — F4312 Post-traumatic stress disorder, chronic: Secondary | ICD-10-CM | POA: Diagnosis not present

## 2021-02-22 DIAGNOSIS — G4700 Insomnia, unspecified: Secondary | ICD-10-CM | POA: Diagnosis not present

## 2021-02-22 DIAGNOSIS — F330 Major depressive disorder, recurrent, mild: Secondary | ICD-10-CM | POA: Diagnosis not present

## 2021-05-07 DIAGNOSIS — F3341 Major depressive disorder, recurrent, in partial remission: Secondary | ICD-10-CM | POA: Diagnosis not present

## 2021-05-07 DIAGNOSIS — F609 Personality disorder, unspecified: Secondary | ICD-10-CM | POA: Diagnosis not present

## 2021-05-07 DIAGNOSIS — G4700 Insomnia, unspecified: Secondary | ICD-10-CM | POA: Diagnosis not present

## 2021-05-07 DIAGNOSIS — Z6835 Body mass index (BMI) 35.0-35.9, adult: Secondary | ICD-10-CM | POA: Diagnosis not present

## 2021-05-07 DIAGNOSIS — Z79899 Other long term (current) drug therapy: Secondary | ICD-10-CM | POA: Diagnosis not present

## 2021-05-07 DIAGNOSIS — E7800 Pure hypercholesterolemia, unspecified: Secondary | ICD-10-CM | POA: Diagnosis not present

## 2021-05-07 DIAGNOSIS — Z131 Encounter for screening for diabetes mellitus: Secondary | ICD-10-CM | POA: Diagnosis not present

## 2021-05-07 DIAGNOSIS — F4312 Post-traumatic stress disorder, chronic: Secondary | ICD-10-CM | POA: Diagnosis not present

## 2021-05-07 DIAGNOSIS — R5383 Other fatigue: Secondary | ICD-10-CM | POA: Diagnosis not present

## 2021-07-10 DIAGNOSIS — G4700 Insomnia, unspecified: Secondary | ICD-10-CM | POA: Diagnosis not present

## 2021-07-10 DIAGNOSIS — Z6835 Body mass index (BMI) 35.0-35.9, adult: Secondary | ICD-10-CM | POA: Diagnosis not present

## 2021-07-10 DIAGNOSIS — F1220 Cannabis dependence, uncomplicated: Secondary | ICD-10-CM | POA: Diagnosis not present

## 2021-07-10 DIAGNOSIS — F3341 Major depressive disorder, recurrent, in partial remission: Secondary | ICD-10-CM | POA: Diagnosis not present

## 2021-07-10 DIAGNOSIS — F411 Generalized anxiety disorder: Secondary | ICD-10-CM | POA: Diagnosis not present

## 2021-07-10 DIAGNOSIS — F609 Personality disorder, unspecified: Secondary | ICD-10-CM | POA: Diagnosis not present

## 2021-09-24 DIAGNOSIS — F4312 Post-traumatic stress disorder, chronic: Secondary | ICD-10-CM | POA: Diagnosis not present

## 2021-10-30 DIAGNOSIS — F4312 Post-traumatic stress disorder, chronic: Secondary | ICD-10-CM | POA: Diagnosis not present
